# Patient Record
Sex: Male | Born: 1970 | Race: White | Hispanic: No | Marital: Married | Smoking: Never smoker
Health system: Southern US, Community
[De-identification: ages and names within clinical notes are randomized; demographics above are authoritative.]

## PROBLEM LIST (undated history)

## (undated) DIAGNOSIS — J342 Deviated nasal septum: Secondary | ICD-10-CM

## (undated) DIAGNOSIS — G629 Polyneuropathy, unspecified: Secondary | ICD-10-CM

## (undated) HISTORY — PX: NASAL SEPTUM SURGERY: SHX37

## (undated) HISTORY — DX: Deviated nasal septum: J34.2

## (undated) HISTORY — PX: SPINAL FUSION: SHX223

## (undated) HISTORY — DX: Polyneuropathy, unspecified: G62.9

---

## 2018-05-13 ENCOUNTER — Encounter: Payer: Self-pay | Admitting: Neurology

## 2018-07-11 ENCOUNTER — Ambulatory Visit: Payer: BLUE CROSS/BLUE SHIELD | Admitting: Neurology

## 2018-08-08 NOTE — Progress Notes (Signed)
New Patient Virtual Visit via Video Note The purpose of this virtual visit is to provide medical care while limiting exposure to the novel coronavirus.    Consent was obtained for video visit:  Yes.   Answered questions that patient had about telehealth interaction:  Yes.   I discussed the limitations, risks, security and privacy concerns of performing an evaluation and management service by telemedicine. I also discussed with the patient that there may be a patient responsible charge related to this service. The patient expressed understanding and agreed to proceed.  Pt location: Home Physician Location: office Name of referring provider:  Erline Levine, MD I connected with Andrew Downs at patients initiation/request on 08/09/2018 at  2:00 PM EDT by video enabled telemedicine application and verified that I am speaking with the correct person using two identifiers. Pt MRN:  [id number] Pt DOB:  09-May-1970 Video Participants:  Andrew Downs    History of Present Illness: Andrew Downs is a 48 y.o. right-handed Caucasian male with cervical fusion C5-6 (2016) presenting for evaluation of bilateral hand paresthesias.   Starting around 2019, he began having spells of hand cramping, wrist pain, and numbness of the fingers.  He works as Psychologist, prison and probation services and has noticed weakness with lifting objects or with overuse. Sometimes, he wakes up in the morning with his hands tingling.   He also has tingling of the feet.  He does not have diabetes, thyroid disease, or history of alcohol use.  He does not have a PCP.  He underwent cervical fusion at C5-6 by Dr. Vertell Limber in 2016.  He reports having bilateral hand and feet paresthesias, concerning for neuropathy.  Patient was referred to see me for further evaluation.  NCS/EMG of the arms performed in March 22, 2018 was normal.    Past Medical History:  Diagnosis Date  . Deviated septum   . Neuropathy     Past Surgical History:  Procedure Laterality Date  . NASAL  SEPTUM SURGERY    . SPINAL FUSION       Medications:  Outpatient Encounter Medications as of 08/09/2018  Medication Sig  . [DISCONTINUED] albuterol (VENTOLIN HFA) 108 (90 Base) MCG/ACT inhaler INHALE 2 PUFFS BY MOUTH EVERY 4 HOURS AS NEEDED FOR DYSPNEA   No facility-administered encounter medications on file as of 08/09/2018.     Allergies: No Known Allergies  Family History: Family History  Problem Relation Age of Onset  . Heart attack Paternal Grandfather     Social History: Social History   Tobacco Use  . Smoking status: Never Smoker  . Smokeless tobacco: Never Used  Substance Use Topics  . Alcohol use: Yes  . Drug use: Not Currently   Social History   Social History Narrative   Lives with wife and children in a 2 story home.  Has 2 children and 2 step children.  Works for [employer].  Education: high school.      Review of Systems:  CONSTITUTIONAL: No fevers, chills, night sweats, or weight loss.   EYES: No visual changes or eye pain ENT: No hearing changes.  No history of nose bleeds.   RESPIRATORY: No cough, wheezing and shortness of breath.   CARDIOVASCULAR: Negative for chest pain, and palpitations.   GI: Negative for abdominal discomfort, blood in stools or black stools.  No recent change in bowel habits.   GU:  No history of incontinence.   MUSCLOSKELETAL: No history of joint pain or swelling.  No myalgias.   SKIN: Negative for lesions,  rash, and itching.   HEMATOLOGY/ONCOLOGY: Negative for prolonged bleeding, bruising easily, and swollen nodes.  No history of cancer.  ENDOCRINE: Negative for cold or heat intolerance, polydipsia or goiter.   PSYCH:  No depression or anxiety symptoms.   NEURO: As Above.   Vital Signs:  Ht '6\' 1"'$  (1.854 m)   Wt 185 lb (83.9 kg)   BMI 24.41 kg/m    General Medical Exam:  Well appearing, comfortable.  Nonlabored breathing.   Neurological Exam: MENTAL STATUS including orientation to time, place, person, recent and  remote memory, attention span and concentration, language, and fund of knowledge is normal.  Speech is not dysarthric.  CRANIAL NERVES:  Normal conjugate, extra-ocular eye movements in all directions of gaze.  No ptosis.  Normal facial symmetry and movements.  Normal shoulder shrug and head rotation.  Tongue is midline.  MOTOR:  Antigravity in all extremities.  No abnormal movements.  No pronator drift.   SENSORY:  Prayer test is positive on left and Phalen's testing is positive bilaterally.   COORDINATION/GAIT: Normal finger to nose bilaterally.  Intact rapid alternating movements bilaterally.  Able to rise from a chair without using arms.  Gait narrow based and stable.   IMPRESSION/PLAN: Bilateral hand paresthesias. Symptoms are concerning for entrapment neuropathy, namely carpal tunnel syndrome. I recommend NCS/EMG of the arms with palmer comparison studies.  In the meantime, I have suggested that he start to use a wrist splint to minimize hyperflexion at the wrist and see if this provides relief.  I will also check ESR, CRP, TSH, vitamin B12, CBC, and CMP.  He was urged to establish care with PCP to keep up with preventative care.   Follow Up Instructions:  I discussed the assessment and treatment plan with the patient. The patient was provided an opportunity to ask questions and all were answered. The patient agreed with the plan and demonstrated an understanding of the instructions.   The patient was advised to call back or seek an in-person evaluation if the symptoms worsen or if the condition fails to improve as anticipated.  Return to clinic after testing   Alda Berthold, DO

## 2018-08-09 ENCOUNTER — Encounter: Payer: Self-pay | Admitting: *Deleted

## 2018-08-09 ENCOUNTER — Other Ambulatory Visit: Payer: Self-pay | Admitting: *Deleted

## 2018-08-09 ENCOUNTER — Telehealth (INDEPENDENT_AMBULATORY_CARE_PROVIDER_SITE_OTHER): Payer: BLUE CROSS/BLUE SHIELD | Admitting: Neurology

## 2018-08-09 ENCOUNTER — Encounter: Payer: Self-pay | Admitting: Neurology

## 2018-08-09 ENCOUNTER — Other Ambulatory Visit: Payer: Self-pay

## 2018-08-09 VITALS — Ht 73.0 in | Wt 185.0 lb

## 2018-08-09 DIAGNOSIS — R202 Paresthesia of skin: Secondary | ICD-10-CM

## 2018-08-09 NOTE — Progress Notes (Signed)
EMG ordered

## 2018-09-19 ENCOUNTER — Ambulatory Visit: Payer: BLUE CROSS/BLUE SHIELD | Admitting: Neurology

## 2018-09-19 HISTORY — PX: REFRACTIVE SURGERY: SHX103

## 2018-10-11 ENCOUNTER — Other Ambulatory Visit: Payer: Self-pay

## 2018-10-11 ENCOUNTER — Encounter: Payer: Self-pay | Admitting: Neurology

## 2018-10-11 ENCOUNTER — Ambulatory Visit (INDEPENDENT_AMBULATORY_CARE_PROVIDER_SITE_OTHER): Payer: BC Managed Care – PPO | Admitting: Neurology

## 2018-10-11 VITALS — BP 122/72 | HR 72 | Ht 73.0 in | Wt 191.0 lb

## 2018-10-11 DIAGNOSIS — G5621 Lesion of ulnar nerve, right upper limb: Secondary | ICD-10-CM | POA: Insufficient documentation

## 2018-10-11 DIAGNOSIS — R252 Cramp and spasm: Secondary | ICD-10-CM

## 2018-10-11 DIAGNOSIS — G5603 Carpal tunnel syndrome, bilateral upper limbs: Secondary | ICD-10-CM

## 2018-10-11 DIAGNOSIS — R202 Paresthesia of skin: Secondary | ICD-10-CM | POA: Diagnosis not present

## 2018-10-11 DIAGNOSIS — M25532 Pain in left wrist: Secondary | ICD-10-CM

## 2018-10-11 DIAGNOSIS — M4716 Other spondylosis with myelopathy, lumbar region: Secondary | ICD-10-CM | POA: Diagnosis not present

## 2018-10-11 MED ORDER — CYCLOBENZAPRINE HCL 5 MG PO TABS: 5.0000 mg | ORAL_TABLET | Freq: Every evening | ORAL | 1 refills | Status: DC | PRN

## 2018-10-11 NOTE — Procedures (Signed)
[facility]  [place], [place]  [address], [place] [address] Tel: [phone] Fax:  [phone] Test Date:  10/11/2018  Patient: Andrew Downs DOB: 1971/01/20 Physician: Narda Amber, DO  Sex: Male Height: 6\' 1"  Ref Phys: Narda Amber, DO  ID#: [id number] Temp: 34.0C Technician:    Patient Complaints: This is a 48 year old man with history of C5-6 fusion (2016) referred for evaluation of bilateral hand tingling, cramping, and left wrist pain.  NCV & EMG Findings: Extensive electrodiagnostic testing of the right upper extremity and additional studies of the left shows:  1. Bilateral median sensory responses show reduced amplitude (R18.2, L19.0 V).  Bilateral mixed palmar sensory responses show absolute prolonged latency.  Bilateral ulnar sensory responses are within normal limits. 2. Bilateral median and left ulnar motor responses are within normal limits.  Right ulnar motor response shows slowed conduction velocity across the elbow (A Elbow-B Elbow, 45 m/s).   3. Chronic motor axonal loss changes are seen affecting the ulnar innervated muscles on the right, without accompanied active denervation.    Impression: 1. 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 2. 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   ___________________________ Narda Amber, DO    Nerve Conduction Studies Anti Sensory Summary Table   Site NR Peak (ms) Norm Peak (ms) P-T Amp (V) Norm P-T Amp  Left Median Anti Sensory (2nd Digit)  34C  Wrist    3.0 <3.4 19.0 >20  Right Median Anti Sensory (2nd Digit)  34C  Wrist    3.0 <3.4 18.2 >20  Left Ulnar Anti Sensory (5th Digit)  34C  Wrist    2.7 <3.1 16.5 >12  Right Ulnar Anti Sensory (5th Digit)  34C  Wrist    2.7 <3.1 20.4 >12   Motor Summary Table   Site NR Onset (ms) Norm Onset (ms) O-P Amp (mV) Norm O-P  Amp Site1 Site2 Delta-0 (ms) Dist (cm) Vel (m/s) Norm Vel (m/s)  Left Median Motor (Abd Poll Brev)  34C  Wrist    2.9 <3.9 12.2 >6 Elbow Wrist 4.9 30.0 61 >50  Elbow    7.8  11.9         Right Median Motor (Abd Poll Brev)  34C  Wrist    2.8 <3.9 11.3 >6 Elbow Wrist 4.9 31.0 63 >50  Elbow    7.7  11.0         Left Ulnar Motor (Abd Dig Minimi)  34C  Wrist    2.1 <3.1 11.6 >7 B Elbow Wrist 3.8 25.0 66 >50  B Elbow    5.9  11.3  A Elbow B Elbow 1.4 10.0 71 >50  A Elbow    7.3  11.0         Right Ulnar Motor (Abd Dig Minimi)  34C  Wrist    2.0 <3.1 11.8 >7 B Elbow Wrist 3.1 23.0 74 >50  B Elbow    5.1  10.7  A Elbow B Elbow 2.2 10.0 45 >50  A Elbow    7.3  10.6          Comparison Summary Table   Site NR Peak (ms) Norm Peak (ms) P-T Amp (V) Site1 Site2 Delta-P (ms) Norm Delta (ms)  Left Median/Ulnar Palm Comparison (Wrist - 8cm)  34C  Median Palm    2.0 <2.2 33.6 Median Palm Ulnar Palm 0.5  Ulnar Palm    1.5 <2.2 17.6      Right Median/Ulnar Palm Comparison (Wrist - 8cm)  34C  Median Palm    2.0 <2.2 31.5 Median Palm Ulnar Palm 0.5   Ulnar Palm    1.5 <2.2 20.1       EMG   Side Muscle Ins Act Fibs Psw Fasc Number Recrt Dur Dur. Amp Amp. Poly Poly. Comment  Left Deltoid Nml Nml Nml Nml Nml Nml Nml Nml Nml Nml Nml Nml N/A  Right Abd Poll Brev Nml Nml Nml Nml Nml Nml Nml Nml Nml Nml Nml Nml N/A  Right Ext Indicis Nml Nml Nml Nml Nml Nml Nml Nml Nml Nml Nml Nml N/A  Right PronatorTeres Nml Nml Nml Nml Nml Nml Nml Nml Nml Nml Nml Nml N/A  Right Biceps Nml Nml Nml Nml Nml Nml Nml Nml Nml Nml Nml Nml N/A  Right Triceps Nml Nml Nml Nml Nml Nml Nml Nml Nml Nml Nml Nml N/A  Right Deltoid Nml Nml Nml Nml Nml Nml Nml Nml Nml Nml Nml Nml N/A  Left Ext Indicis Nml Nml Nml Nml Nml Nml Nml Nml Nml Nml Nml Nml N/A  Left PronatorTeres Nml Nml Nml Nml Nml Nml Nml Nml Nml Nml Nml Nml N/A  Left 1stDorInt Nml Nml Nml Nml Nml Nml Nml Nml Nml Nml Nml Nml N/A  Left Triceps Nml Nml Nml Nml Nml Nml  Nml Nml Nml Nml Nml Nml N/A  Left Biceps Nml Nml Nml Nml Nml Nml Nml Nml Nml Nml Nml Nml N/A  Right FlexCarpiUln Nml Nml Nml Nml 1- Rapid Some 1+ Some 1+ Some 1+ N/A  Right 1stDorInt Nml Nml Nml Nml 1- Rapid Some 1+ Some 1+ Some 1+ N/A  Right ABD Dig Min Nml Nml Nml Nml 1- Rapid Some 1+ Some 1+ Some 1+ N/A      Waveforms:

## 2018-10-11 NOTE — Progress Notes (Signed)
Follow-up Visit   Date: 10/11/18   Andrew Downs MRN: [id number] DOB: October 08, 1970   Interim History: Andrew GoltzBarry Crudup is a 48 y.o. right-handed Caucasian male with history of cervical fusion at C5-6 (2016) returning to the clinic for follow-up of bilateral hand paresthesias and left wrist pain.  The patient was accompanied to the clinic by self.   History of present illness: Starting around 2019, he began having spells of hand cramping, wrist pain, and numbness of the fingers.  He works as [employer] and has noticed weakness with lifting objects or with overuse. Sometimes, he wakes up in the morning with his hands tingling.   He also has tingling of the feet.  He does not have diabetes, thyroid disease, or history of alcohol use.  He does not have a PCP.  He underwent cervical fusion at C5-6 by Dr. Venetia MaxonStern in 2016.  He reports having bilateral hand and feet paresthesias, concerning for neuropathy.  Patient was referred to see me for further evaluation.  NCS/EMG of the arms performed in March 22, 2018 was normal.   UPDATE 10/11/2018: He is here for follow-up of bilateral hand tingling and also complains of achy left wrist pain.  At his initial visit with me via video, I recommended that he start to use a wrist splint and he reports much less tingling in the hand after doing this.  He is mostly bothered by achy left wrist pain, which is worse with extension and flexion.  He continues to have painful cramps in the hands which often occurs in the early morning.  He also complains of almost daily spells of shooting radicular left leg pain.  Pain is worse with foot extension, such as pressing on the clutch in his car.  He tells me that prior MRI lumbar spine several years ago indicated disc protrusion, however it was decided to manage this conservatively, unless symptoms worsen.  He denies any weakness of the leg.  Medications:  No current outpatient medications on file prior to visit.   No current  facility-administered medications on file prior to visit.     Allergies: No Known Allergies  Review of Systems:  CONSTITUTIONAL: No fevers, chills, night sweats, or weight loss.  EYES: No visual changes or eye pain ENT: No hearing changes.  No history of nose bleeds.   RESPIRATORY: No cough, wheezing and shortness of breath.   CARDIOVASCULAR: Negative for chest pain, and palpitations.   GI: Negative for abdominal discomfort, blood in stools or black stools.  No recent change in bowel habits.   GU:  No history of incontinence.   MUSCLOSKELETAL: No history of joint pain or swelling.  No myalgias.   SKIN: Negative for lesions, rash, and itching.   ENDOCRINE: Negative for cold or heat intolerance, polydipsia or goiter.   PSYCH:  No depression or anxiety symptoms.   NEURO: As Above.   Vital Signs:  BP 122/72   Pulse 72   Ht 6\' 1"  (1.854 m)   Wt 191 lb (86.6 kg)   SpO2 96%   BMI 25.20 kg/m    General Medical Exam:   General:  Well appearing, comfortable  Eyes/ENT: see cranial nerve examination.   Neck:  No carotid bruits. Respiratory:  Clear to auscultation, good air entry bilaterally.   Cardiac:  Regular rate and rhythm, no murmur.   Ext:  No edema   Neurological Exam: MENTAL STATUS including orientation to time, place, person, recent and remote memory, attention span and concentration, language,  and fund of knowledge is normal.  Speech is not dysarthric.  CRANIAL NERVES:  No visual field defects.  Pupils equal round and reactive to light.  Normal conjugate, extra-ocular eye movements in all directions of gaze.  No ptosis.  Face is symmetric. Palate elevates symmetrically.  Tongue is midline.  MOTOR:  Motor strength is 5/5 in all extremities.  No atrophy, fasciculations or abnormal movements.  No pronator drift.  Tone is normal.    MSRs:                                           Right        Left brachioradialis 2+  2+  biceps 2+  2+  triceps 2+  2+  patellar 3+  3+   ankle jerk 2+  2+  Hoffman no  no  plantar response down  down   SENSORY:  Intact to vibration, temperature, and pinprick throughout.  COORDINATION/GAIT:   Gait narrow based and stable.   Data: NCS/EMG of the arms 10/11/2018: 1. 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 2. 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  IMPRESSION/PLAN: 1.  Bilateral carpal tunnel syndrome, mild.    - Recommend that he start using wrist splints and avoid hyperflexion of the wrist.   2.  Right cubital tunnel syndrome, mild.    - Patient was made aware of arm positioning which can stretch or compress the ulnar nerve at the elbow, and I discussed strategies to minimize this. 3.  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  - MRI lumbar spine wo contrast to assess for compressive lesion 4.  Bilateral hand cramps, possibly related to history of cervical canal stenosis  -Start Flexeril 5 mg at bedtime as needed for cramps, this may also help with his leg pain. 5.  Left wrist pain, seems musculoskeletal.  I will refer him to Dr. Hulan Saas in Sports Medicine  Further recommendations pending results.  Thank you for allowing me to participate in patient's care.  If I can answer any additional questions, I would be pleased to do so.    Sincerely,    Catrell Morrone K. Posey Pronto, DO

## 2018-10-11 NOTE — Patient Instructions (Addendum)
MRI lumbar spine without contrast  Referral to Sports Medicine   Start flexeril 5mg  at bedtime for muscle cramps  We will call you with the results

## 2018-11-03 ENCOUNTER — Other Ambulatory Visit: Payer: Self-pay

## 2018-11-03 ENCOUNTER — Ambulatory Visit (INDEPENDENT_AMBULATORY_CARE_PROVIDER_SITE_OTHER): Payer: BC Managed Care – PPO | Admitting: Family Medicine

## 2018-11-03 ENCOUNTER — Encounter: Payer: Self-pay | Admitting: Family Medicine

## 2018-11-03 ENCOUNTER — Ambulatory Visit: Payer: Self-pay

## 2018-11-03 VITALS — BP 120/82 | HR 61 | Ht 73.0 in | Wt 191.0 lb

## 2018-11-03 DIAGNOSIS — M25532 Pain in left wrist: Secondary | ICD-10-CM

## 2018-11-03 DIAGNOSIS — M720 Palmar fascial fibromatosis [Dupuytren]: Secondary | ICD-10-CM | POA: Insufficient documentation

## 2018-11-03 NOTE — Patient Instructions (Signed)
Good to see you  Wrap fingers straight with sleeping for next 2 weeks Will take 2 weeks for the injections to work  Get some gloves with gel pads on the palms  See me again in 6 weeks if not better we will consider labs

## 2018-11-03 NOTE — Assessment & Plan Note (Signed)
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  We does not seem to be chronic at the moment.  I do think that there is a possible vascular compromise Secondary to atrophy noted on ultrasound.  Patient has been doing bracing at night, responding well to the injections.  Follow-up with me again in 5 to 6 weeks.

## 2018-11-03 NOTE — Progress Notes (Signed)
[facility] [address] [place], [place] [address] Phone: [phone] Subjective:   I Andrew Downs am serving as a Education administrator for Dr. Hulan Saas.  I'm seeing this patient by the request  of:  Patel DO  CC: Left wrist pain  LPF:[id number]  Andrew Downs is a 48 y.o. male coming in with complaint of left wrist pain. Has some numbness in the fingerings and cramps in the 4th and 5th fingers.   Onset- Chronic Location - wrist  Duration-  Character- sharp, dull, achy, sore Aggravating factors- weakness in the wrist, weak by the end of the day  Reliving factors- icy hot, carpel tunnel brace at night  Therapies tried-  Severity-sometimes 8 out of 10     Past Medical History:  Diagnosis Date  . Deviated septum   . Neuropathy    Past Surgical History:  Procedure Laterality Date  . NASAL SEPTUM SURGERY    . REFRACTIVE SURGERY Bilateral 09/2018  . SPINAL FUSION     Social History   Socioeconomic History  . Marital status: Married    Spouse name: Not on file  . Number of children: 2  . Years of education: 43  . Highest education level: High school graduate  Occupational History    Employer: [employer]  . Financial resource strain: Not on file  . Food insecurity    Worry: Not on file    Inability: Not on file  . Transportation needs    Medical: Not on file    Non-medical: Not on file  Tobacco Use  . Smoking status: Never Smoker  . Smokeless tobacco: Never Used  Substance and Sexual Activity  . Alcohol use: Yes  . Drug use: Not Currently  . Sexual activity: Not on file  Lifestyle  . Physical activity    Days per week: Not on file    Minutes per session: Not on file  . Stress: Not on file  Relationships  . Social Herbalist on phone: Not on file    Gets together: Not on file    Attends religious service: Not on file    Active member of club or organization: Not on file    Attends meetings of clubs or  organizations: Not on file    Relationship status: Not on file  Other Topics Concern  . Not on file  Social History Narrative   Lives with wife and children in a 2 story home.  Has 2 children and 2 step children.  Works for [employer].  Education: high school.     No Known Allergies Family History  Problem Relation Age of Onset  . Heart attack Paternal Grandfather          Current Outpatient Medications (Other):  .  cyclobenzaprine (FLEXERIL) 5 MG tablet, Take 1 tablet (5 mg total) by mouth at bedtime as needed for muscle spasms.    Past medical history, social, surgical and family history all reviewed in electronic medical record.  No pertanent information unless stated regarding to the chief complaint.   Review of Systems:  No headache, visual changes, nausea, vomiting, diarrhea, constipation, dizziness, abdominal pain, skin rash, fevers, chills, night sweats, weight loss, swollen lymph nodes, body aches, joint swelling, muscle aches, chest pain, shortness of breath, mood changes.   Objective  Blood pressure 120/82, pulse 61, height 6\' 1"  (1.854 m), weight 191 lb (86.6 kg), SpO2 94 %.   General: No apparent distress  alert and oriented x3 mood and affect normal, dressed appropriately.  HEENT: Pupils equal, extraocular movements intact  Respiratory: Patient's speak in full sentences and does not appear short of breath  Cardiovascular: No lower extremity edema, non tender, no erythema  Skin: Warm dry intact with no signs of infection or rash on extremities or on axial skeleton.  Abdomen: Soft nontender  Neuro: Cranial nerves II through XII are intact, neurovascularly intact in all extremities with 2+ DTRs and 2+ pulses.  Lymph: No lymphadenopathy of posterior or anterior cervical chain or axillae bilaterally.  Gait normal with good balance and coordination.  MSK:  Non tender with full range of motion and good stability and symmetric strength and tone of shoulders, elbows,   hip, knee and ankles bilaterally.  Patient was moving bilateral extremities and negative Tinel sign today.  Near full range of motion.  Left hand does have some trigger nodules of the A2 pulley of the fourth and fifth fingers.  Seems to be more of a scarring aspect that seems to be between the fingers.  Nodules are noted within the tendon sheath.  Good grip strength.  Neurovascular intact distally.  No pain over Guyon's canal  Limited musculoskeletal ultrasound was performed by Judi SaaZachary M Gwyndolyn Guilford  Limited ultrasound shows the patient is to trigger flexor tendon sheaths of the fourth and fifth metatarsals.  Seems to be chronic with calcific changes.  No increase in Doppler flow Impression: Trigger nodules versus possible early contracture  Procedure: Real-time Ultrasound Guided Injection of left fifth flexor tendon sheath Device: GE Logiq Q7 Ultrasound guided injection is preferred based studies that show increased duration, increased effect, greater accuracy, decreased procedural pain, increased response rate, and decreased cost with ultrasound guided versus blind injection.  Verbal informed consent obtained.  Time-out conducted.  Noted no overlying erythema, induration, or other signs of local infection.  Skin prepped in a sterile fashion.  Local anesthesia: Topical Ethyl chloride.  With sterile technique and under real time ultrasound guidance: 25-gauge half inch needle injected with 0.5 cc of 0.5% Marcaine and 0.5 cc of Kenalog 40 mg per Completed without difficulty  Pain immediately resolved suggesting accurate placement of the medication.  Advised to call if fevers/chills, erythema, induration, drainage, or persistent bleeding.  Images permanently stored and available for review in the ultrasound unit.  Impression: Technically successful ultrasound guided injection.  Procedure: Real-time Ultrasound Guided Injection of left fourth flexor tendon sheath Device: GE Logiq Q7 Ultrasound guided  injection is preferred based studies that show increased duration, increased effect, greater accuracy, decreased procedural pain, increased response rate, and decreased cost with ultrasound guided versus blind injection.  Verbal informed consent obtained.  Time-out conducted.  Noted no overlying erythema, induration, or other signs of local infection.  Skin prepped in a sterile fashion.  Local anesthesia: Topical Ethyl chloride.  With sterile technique and under real time ultrasound guidance: With a 25-gauge half inch needle injected with 0.5 cc of 0.5% Marcaine and 0.5 cc of Kenalog 40 mg per Completed without difficulty  Pain immediately resolved suggesting accurate placement of the medication.  Advised to call if fevers/chills, erythema, induration, drainage, or persistent bleeding.  Images permanently stored and available for review in the ultrasound unit.  Impression: Technically successful ultrasound guided injection.    Impression and Recommendations:     This case required medical decision making of moderate complexity. The above documentation has been reviewed and is accurate and complete Judi SaaZachary M Naaman Curro, DO  Note: This dictation was prepared with Dragon dictation along with smaller phrase technology. Any transcriptional errors that result from this process are unintentional.

## 2018-11-04 ENCOUNTER — Ambulatory Visit
Admission: RE | Admit: 2018-11-04 | Discharge: 2018-11-04 | Disposition: A | Discharge status: Home / Self Care | Payer: BC Managed Care – PPO | Source: Ambulatory Visit | Attending: Neurology | Admitting: Neurology

## 2018-11-04 DIAGNOSIS — M4716 Other spondylosis with myelopathy, lumbar region: Secondary | ICD-10-CM

## 2018-11-07 ENCOUNTER — Telehealth: Payer: Self-pay

## 2018-11-07 ENCOUNTER — Telehealth: Payer: Self-pay | Admitting: Neurology

## 2018-11-07 NOTE — Telephone Encounter (Signed)
New Message ° °Patient returning nurses call °

## 2018-11-07 NOTE — Telephone Encounter (Signed)
Left message for patient to call office for results. °

## 2018-11-08 NOTE — Telephone Encounter (Signed)
-----   Message from Donika K Patel, DO sent at 11/07/2018  9:49 AM EDT ----- Please inform patient that his MRI lumbar spine is stable, mild age-related/degenerative changes with no nerve impingement.  If he is still having pain in the left leg, I recommend physical therapy - pls order, if he is agreeable. Thanks. 

## 2018-11-08 NOTE — Telephone Encounter (Signed)
Left message for patient to contact office for results.

## 2018-11-09 ENCOUNTER — Telehealth: Payer: Self-pay

## 2018-11-09 NOTE — Telephone Encounter (Signed)
Informed patient of MRI results.  Patient declines physical therapy at this time.  He has done it in the past and did not have long term relief.

## 2018-11-09 NOTE — Telephone Encounter (Signed)
-----   Message from Andrew Berthold, DO sent at 11/07/2018  9:49 AM EDT ----- Please inform patient that his MRI lumbar spine is stable, mild age-related/degenerative changes with no nerve impingement.  If he is still having pain in the left leg, I recommend physical therapy - pls order, if he is agreeable. Thanks.

## 2018-12-12 NOTE — Progress Notes (Signed)
Andrew Downs D.O. [facility] [address] [place], [place] [address] Phone: [phone] Subjective:   Andrew Downs, Andrew Downs, am serving as a scribe for Dr. Antoine PrimasZachary Downs.  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  We does not seem to be chronic at the moment.  I do think that there is a possible vascular compromise Secondary to atrophy noted on ultrasound.  Patient has been doing bracing at night, responding well to the injections.  Follow-up with me again in 5 to 6 weeks.  Update 12/13/2018 Andrew GoltzBarry Downs is a 48 y.o. male coming in with complaint of left hand pain. Patient states that his cramping is less. Injection did lessen the intensity of the cramping. Is having left wrist pain near the ulnar styloid process. Is now having cramping in the right hand.  Patient is concerned because he does not want him to get as bad as the other side.  Did not feel that the trigger injections were somewhat helpful. Impression:  1. 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 2. 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     Past Medical History:  Diagnosis Date  . Deviated septum   . Neuropathy    Past Surgical History:  Procedure Laterality Date  . NASAL SEPTUM SURGERY    . REFRACTIVE SURGERY Bilateral 09/2018  . SPINAL FUSION     Social History   Socioeconomic History  . Marital status: Married    Spouse name: Not on file  . Number of children: 2  . Years of education: 4812  . Highest education level: High school graduate  Occupational History    Employer: [employer]  Social Needs  . Financial resource strain: Not on file  . Food insecurity    Worry: Not on file    Inability: Not on file  .  Transportation needs    Medical: Not on file    Non-medical: Not on file  Tobacco Use  . Smoking status: Never Smoker  . Smokeless tobacco: Never Used  Substance and Sexual Activity  . Alcohol use: Yes  . Drug use: Not Currently  . Sexual activity: Not on file  Lifestyle  . Physical activity    Days per week: Not on file    Minutes per session: Not on file  . Stress: Not on file  Relationships  . Social Musicianconnections    Talks on phone: Not on file    Gets together: Not on file    Attends religious service: Not on file    Active member of club or organization: Not on file    Attends meetings of clubs or organizations: Not on file    Relationship status: Not on file  Other Topics Concern  . Not on file  Social History Narrative   Lives with wife and children in a 2 story home.  Has 2 children and 2 step children.  Works for [employer].  Education: high school.     No Known Allergies Family History  Problem Relation Age of Onset  . Heart attack Paternal Grandfather          Current Outpatient Medications (Other):  .  cyclobenzaprine (FLEXERIL) 5 MG tablet, Take 1 tablet (5 mg  total) by mouth at bedtime as needed for muscle spasms. Marland Kitchen  venlafaxine XR (EFFEXOR XR) 37.5 MG 24 hr capsule, Take 1 capsule (37.5 mg total) by mouth daily with breakfast.    Past medical history, social, surgical and family history all reviewed in electronic medical record.  No pertanent information unless stated regarding to the chief complaint.   Review of Systems:  No headache, visual changes, nausea, vomiting, diarrhea, constipation, dizziness, abdominal pain, skin rash, fevers, chills, night sweats, weight loss, swollen lymph nodes, body aches, joint swelling, muscle aches, chest pain, shortness of breath, mood changes.  Positive muscle aches  Objective  Blood pressure 102/62, pulse 80, height 6\' 1"  (1.854 m), weight 191 lb (86.6 kg), SpO2 95 %.     General: No apparent distress alert  and oriented x3 mood and affect normal, dressed appropriately.  HEENT: Pupils equal, extraocular movements intact  Respiratory: Patient's speak in full sentences and does not appear short of breath  Cardiovascular: No lower extremity edema, non tender, no erythema  Skin: Warm dry intact with no signs of infection or rash on extremities or on axial skeleton.  Abdomen: Soft nontender  Neuro: Cranial nerves II through XII are intact, neurovascularly intact in all extremities with 2+ DTRs and 2+ pulses.  Lymph: No lymphadenopathy of posterior or anterior cervical chain or axillae bilaterally.  Gait normal with good balance and coordination.  MSK:  tender with full range of motion and good stability and symmetric strength and tone of shoulders, elbows,  hip, knee and ankles bilaterally.  Bilateral hand exam shows the patient does have more of a Dupuytren contracture but improvement noted on the left side.  Patient is starting to get the right side in the fourth finger flexor tendon sheath.  Patient states that the fifth 1 seems to be more painful.  Denies any numbness and good grip strength mild positive Tinel's bilaterally   Impression and Recommendations:     This case required medical decision making of moderate complexity. The above documentation has been reviewed and is accurate and complete [name], DO       Note: This dictation was prepared with Dragon dictation along with smaller phrase technology. Any transcriptional errors that result from this process are unintentional.

## 2018-12-13 ENCOUNTER — Ambulatory Visit: Payer: BC Managed Care – PPO | Admitting: Family Medicine

## 2018-12-13 ENCOUNTER — Other Ambulatory Visit: Payer: Self-pay

## 2018-12-13 ENCOUNTER — Other Ambulatory Visit (INDEPENDENT_AMBULATORY_CARE_PROVIDER_SITE_OTHER): Payer: BC Managed Care – PPO

## 2018-12-13 VITALS — BP 102/62 | HR 80 | Ht 73.0 in | Wt 191.0 lb

## 2018-12-13 DIAGNOSIS — G5603 Carpal tunnel syndrome, bilateral upper limbs: Secondary | ICD-10-CM

## 2018-12-13 DIAGNOSIS — M720 Palmar fascial fibromatosis [Dupuytren]: Secondary | ICD-10-CM

## 2018-12-13 DIAGNOSIS — M2550 Pain in unspecified joint: Secondary | ICD-10-CM

## 2018-12-13 LAB — FERRITIN: Ferritin: 35.1 ng/mL (ref 22.0–322.0)

## 2018-12-13 LAB — COMPREHENSIVE METABOLIC PANEL
ALT: 19 U/L (ref 0–53)
AST: 17 U/L (ref 0–37)
Albumin: 4.7 g/dL (ref 3.5–5.2)
Alkaline Phosphatase: 63 U/L (ref 39–117)
BUN: 17 mg/dL (ref 6–23)
CO2: 27 mEq/L (ref 19–32)
Calcium: 9.5 mg/dL (ref 8.4–10.5)
Chloride: 104 mEq/L (ref 96–112)
Creatinine, Ser: 0.7 mg/dL (ref 0.40–1.50)
GFR: 119.8 mL/min (ref >60.00)
Glucose, Bld: 121 mg/dL — ABNORMAL HIGH (ref 70–99)
Potassium: 3.7 mEq/L (ref 3.5–5.1)
Sodium: 139 mEq/L (ref 135–145)
Total Bilirubin: 0.4 mg/dL (ref 0.2–1.2)
Total Protein: 7.3 g/dL (ref 6.0–8.3)

## 2018-12-13 LAB — CBC WITH DIFFERENTIAL/PLATELET
Basophils Absolute: 0.1 10*3/uL (ref 0.0–0.1)
Basophils Relative: 1.2 % (ref 0.0–3.0)
Eosinophils Absolute: 0.2 10*3/uL (ref 0.0–0.7)
Eosinophils Relative: 2.2 % (ref 0.0–5.0)
HCT: 44.5 % (ref 39.0–52.0)
Hemoglobin: 15.2 g/dL (ref 13.0–17.0)
Lymphocytes Relative: 24.2 % (ref 12.0–46.0)
Lymphs Abs: 2.2 10*3/uL (ref 0.7–4.0)
MCHC: 34.2 g/dL (ref 30.0–36.0)
MCV: 89.7 fl (ref 78.0–100.0)
Monocytes Absolute: 1.1 10*3/uL — ABNORMAL HIGH (ref 0.1–1.0)
Monocytes Relative: 12.3 % — ABNORMAL HIGH (ref 3.0–12.0)
Neutro Abs: 5.5 10*3/uL (ref 1.4–7.7)
Neutrophils Relative %: 60.1 % (ref 43.0–77.0)
Platelets: 247 10*3/uL (ref 150.0–400.0)
RBC: 4.96 Mil/uL (ref 4.22–5.81)
RDW: 13 % (ref 11.5–15.5)
WBC: 9.1 10*3/uL (ref 4.0–10.5)

## 2018-12-13 LAB — IBC PANEL
Iron: 110 ug/dL (ref 42–165)
Saturation Ratios: 26.1 % (ref 20.0–50.0)
Transferrin: 301 mg/dL (ref 212.0–360.0)

## 2018-12-13 LAB — URIC ACID: Uric Acid, Serum: 5 mg/dL (ref 4.0–7.8)

## 2018-12-13 LAB — VITAMIN D 25 HYDROXY (VIT D DEFICIENCY, FRACTURES): VITD: 28.82 ng/mL — ABNORMAL LOW (ref 30.00–100.00)

## 2018-12-13 LAB — SEDIMENTATION RATE: Sed Rate: 4 mm/hr (ref 0–15)

## 2018-12-13 LAB — TESTOSTERONE: Testosterone: 242.85 ng/dL — ABNORMAL LOW (ref 300.00–890.00)

## 2018-12-13 LAB — TSH: TSH: 1.1 u[IU]/mL (ref 0.35–4.50)

## 2018-12-13 LAB — C-REACTIVE PROTEIN: CRP: <1 mg/dL (ref 0.5–20.0)

## 2018-12-13 MED ORDER — VENLAFAXINE HCL ER 37.5 MG PO CP24: 37.5000 mg | ORAL_CAPSULE | Freq: Every day | ORAL | 0 refills | Status: DC

## 2018-12-13 NOTE — Patient Instructions (Signed)
Labs downstairs Effexor 37.5 Monitor other side See me 4-6 weeks

## 2018-12-14 ENCOUNTER — Encounter: Payer: Self-pay | Admitting: Family Medicine

## 2018-12-14 LAB — RHEUMATOID FACTOR: Rheumatoid fact SerPl-aCnc: <14 IU/mL (ref <14)

## 2018-12-14 LAB — PTH, INTACT AND CALCIUM
Calcium: 9.8 mg/dL (ref 8.6–10.3)
PTH: 25 pg/mL (ref 14–64)

## 2018-12-14 LAB — ANA: Anti Nuclear Antibody (ANA): NEGATIVE

## 2018-12-14 LAB — ANGIOTENSIN CONVERTING ENZYME: Angiotensin-Converting Enzyme: 15 U/L (ref 9–67)

## 2018-12-14 LAB — CYCLIC CITRUL PEPTIDE ANTIBODY, IGG: Cyclic Citrullin Peptide Ab: 16 UNITS

## 2018-12-14 LAB — CALCIUM, IONIZED: Calcium, Ion: 5.21 mg/dL (ref 4.8–5.6)

## 2018-12-14 NOTE — Assessment & Plan Note (Signed)
Mild positive Tinel's but patient's symptoms are all in the ulnar nerve.  We will continue to monitor.

## 2018-12-14 NOTE — Assessment & Plan Note (Signed)
Patient did improve after the injections.  Can repeat.  Starting to have it bilateral.  Patient has had the EMG that was consistent with some peripheral mononeuropathy.  Further work-up will include laboratory work-up today.  See how patient responds and follow-up in 4 weeks

## 2019-01-21 ENCOUNTER — Other Ambulatory Visit: Payer: Self-pay | Admitting: Neurology

## 2019-01-23 ENCOUNTER — Other Ambulatory Visit: Payer: Self-pay

## 2019-01-23 MED ORDER — CYCLOBENZAPRINE HCL 5 MG PO TABS: 5.0000 mg | ORAL_TABLET | Freq: Every evening | ORAL | 1 refills | Status: AC | PRN

## 2019-01-23 NOTE — Progress Notes (Signed)
[facility] [address] [place], [place] [address] Phone: [phone] Subjective:   Andrew Downs, am serving as a scribe for Dr. Antoine Downs.   CC: Left wrist follow-up  PIR:Andrew Downs   8/25/202 Mild positive Tinel's but patient's symptoms are all in the ulnar nerve.  We will continue to monitor.  Patient did improve after the injections.  Can repeat.  Starting to have it bilateral.  Patient has had the EMG that was consistent with some peripheral mononeuropathy.  Further work-up will include laboratory work-up today.  See how patient responds and follow-up in 4 weeks  Update 01/24/2019 Andrew Downs is a 48 y.o. male coming in with complaint of left hand pain. Patient is also having pain in right hand. Patient states that 4th and 5th fingers are cramping every other day. Cramping has been occurring for 15 minute increments throughout the days when it occurs. Labs last visit.  Patient states that he is taking the Effexor 70 do better.  Ran out of the Effexor approximately 10 days ago.   Laboratory work-up showed patient did have low vitamin D as well as testosterone. Past Medical History:  Diagnosis Date  . Deviated septum   . Neuropathy    Past Surgical History:  Procedure Laterality Date  . NASAL SEPTUM SURGERY    . REFRACTIVE SURGERY Bilateral 09/2018  . SPINAL FUSION     Social History   Socioeconomic History  . Marital status: Married    Spouse name: Not on file  . Number of children: 2  . Years of education: 10  . Highest education level: High school graduate  Occupational History    Employer: [employer]  Social Needs  . Financial resource strain: Not on file  . Food insecurity    Worry: Not on file    Inability: Not on file  . Transportation needs    Medical: Not on file    Non-medical: Not on file  Tobacco Use  . Smoking status: Never Smoker  . Smokeless tobacco: Never Used  Substance and Sexual Activity  . Alcohol use:  Yes  . Drug use: Not Currently  . Sexual activity: Not on file  Lifestyle  . Physical activity    Days per week: Not on file    Minutes per session: Not on file  . Stress: Not on file  Relationships  . Social Musician on phone: Not on file    Gets together: Not on file    Attends religious service: Not on file    Active member of club or organization: Not on file    Attends meetings of clubs or organizations: Not on file    Relationship status: Not on file  Other Topics Concern  . Not on file  Social History Narrative   Lives with wife and children in a 2 story home.  Has 2 children and 2 step children.  Works for [employer].  Education: high school.     No Known Allergies Family History  Problem Relation Age of Onset  . Heart attack Paternal Grandfather          Current Outpatient Medications (Other):  .  cyclobenzaprine (FLEXERIL) 5 MG tablet, Take 1 tablet (5 mg total) by mouth at bedtime as needed for muscle spasms. Marland Kitchen  venlafaxine XR (EFFEXOR XR) 37.5 MG 24 hr capsule, Take 1 capsule (37.5 mg total) by mouth daily with breakfast. .  venlafaxine XR (EFFEXOR XR) 75 MG  24 hr capsule, Take 1 capsule (75 mg total) by mouth daily with breakfast. .  Vitamin D, Ergocalciferol, (DRISDOL) 1.25 MG (50000 UT) CAPS capsule, Take 1 capsule (50,000 Units total) by mouth every 7 (seven) days.    Past medical history, social, surgical and family history all reviewed in electronic medical record.  No pertanent information unless stated regarding to the chief complaint.   Review of Systems:  No headache, visual changes, nausea, vomiting, diarrhea, constipation, dizziness, abdominal pain, skin rash, fevers, chills, night sweats, weight loss, swollen lymph nodes, body aches, joint swelling,chest pain, shortness of breath, mood changes.  Positive muscle aches muscle cramping  Objective  Blood pressure 112/80, pulse 96, height 6\' 1"  (1.854 m), weight 194 lb (88 kg), SpO2 (!)  80 %.    General: No apparent distress alert and oriented x3 mood and affect normal, dressed appropriately.  HEENT: Pupils equal, extraocular movements intact  Respiratory: Patient's speak in full sentences and does not appear short of breath  Cardiovascular: No lower extremity edema, non tender, no erythema  Skin: Warm dry intact with no signs of infection or rash on extremities or on axial skeleton.  Abdomen: Soft nontender  Neuro: Cranial nerves II through XII are intact, neurovascularly intact in all extremities with 2+ DTRs and 2+ pulses.  Lymph: No lymphadenopathy of posterior or anterior cervical chain or axillae bilaterally.  Gait normal with good balance and coordination.  MSK:  Non tender with full range of motion and good stability and symmetric strength and tone of shoulders, elbows, hip, knee and ankles bilaterally.  Wrist: Bilateral Inspection normal with no visible erythema or swelling. ROM smooth and normal with good flexion and extension and ulnar/radial deviation that is symmetrical with opposite wrist. Palpation is normal over metacarpals, navicular, lunate, and TFCC; tendons without tenderness/ swelling No snuffbox tenderness. No tenderness over Canal of Guyon. Strength 5/5 in all directions without pain. Negative Finkelstein, tinel's and phalens. Negative Watson's test.  MSK US performed of:  This study was ordered, performed, and interpreted by Andrew Downs D.O.  Wrist: All extensor compartments visualized and tendons all normal in appearance without fraying, tears, or sheath effusions. No effusion seen. TFCC intact. Scapholunate ligament intact. Carpal tunnel visualized and median nerve area normal, flexor tendons all normal in appearance without fraying, tears, or sheath effusions. Power doppler signal normal.  IMPRESSION:  NORMAL ULTRASONOGRAPHIC EXAMINATION OF THE WRIST.   Impression and Recommendations:     This case required medical decision making of  moderate complexity. The above documentation has been reviewed and is accurate and complete Andrew Pulley, DO       Note: This dictation was prepared with Dragon dictation along with smaller phrase technology. Any transcriptional errors that result from this process are unintentional.

## 2019-01-24 ENCOUNTER — Encounter: Payer: Self-pay | Admitting: Family Medicine

## 2019-01-24 ENCOUNTER — Ambulatory Visit: Payer: BC Managed Care – PPO | Admitting: Family Medicine

## 2019-01-24 ENCOUNTER — Other Ambulatory Visit: Payer: Self-pay

## 2019-01-24 VITALS — BP 112/80 | HR 96 | Ht 73.0 in | Wt 194.0 lb

## 2019-01-24 DIAGNOSIS — E559 Vitamin D deficiency, unspecified: Secondary | ICD-10-CM | POA: Diagnosis not present

## 2019-01-24 DIAGNOSIS — R7989 Other specified abnormal findings of blood chemistry: Secondary | ICD-10-CM | POA: Diagnosis not present

## 2019-01-24 DIAGNOSIS — G589 Mononeuropathy, unspecified: Secondary | ICD-10-CM | POA: Diagnosis not present

## 2019-01-24 DIAGNOSIS — M2550 Pain in unspecified joint: Secondary | ICD-10-CM | POA: Diagnosis not present

## 2019-01-24 MED ORDER — VITAMIN D (ERGOCALCIFEROL) 1.25 MG (50000 UNIT) PO CAPS: 50000.0000 [IU] | ORAL_CAPSULE | ORAL | 0 refills | Status: DC

## 2019-01-24 MED ORDER — VENLAFAXINE HCL ER 75 MG PO CP24: 75.0000 mg | ORAL_CAPSULE | Freq: Every day | ORAL | 0 refills | Status: AC

## 2019-01-24 NOTE — Patient Instructions (Addendum)
Once weekly Vitamin D DHEA 50 mg daily for 4 weeks then 2 weeks off Effexor at 75mg  at night See me in 6-7 weeks

## 2019-01-25 ENCOUNTER — Encounter: Payer: Self-pay | Admitting: Family Medicine

## 2019-01-25 DIAGNOSIS — G629 Polyneuropathy, unspecified: Secondary | ICD-10-CM | POA: Insufficient documentation

## 2019-01-25 DIAGNOSIS — E559 Vitamin D deficiency, unspecified: Secondary | ICD-10-CM | POA: Insufficient documentation

## 2019-01-25 DIAGNOSIS — R7989 Other specified abnormal findings of blood chemistry: Secondary | ICD-10-CM | POA: Insufficient documentation

## 2019-01-25 NOTE — Assessment & Plan Note (Signed)
Patient does have peripheral neuropathy, and does have some carpal tunnel.  Patient though has more of a cramping in the ulnar distribution of the left and right.  With certain responding to the Effexor.  Increase to 75 mg and warned of potential side effects.  I am hoping that this continues to improve.  In addition to this we discussed secondary to the low testosterone and the low vitamin D could be contributing.  See if we can do supplementation and hope this will make some benefit as well. Discussed the possibility of carpal tunnel injections but I do not think secondary to patient's symptoms being more in the ulnar aspect that would make a significant difference.  Patient will try this and come back and see me again in 6 weeks.

## 2019-02-13 ENCOUNTER — Other Ambulatory Visit: Payer: Self-pay | Admitting: Family Medicine

## 2019-02-15 ENCOUNTER — Other Ambulatory Visit: Payer: Self-pay | Admitting: Family Medicine

## 2019-03-06 ENCOUNTER — Other Ambulatory Visit (INDEPENDENT_AMBULATORY_CARE_PROVIDER_SITE_OTHER): Payer: BC Managed Care – PPO

## 2019-03-06 DIAGNOSIS — M2550 Pain in unspecified joint: Secondary | ICD-10-CM

## 2019-03-06 LAB — TESTOSTERONE: Testosterone: 224.42 ng/dL — ABNORMAL LOW (ref 300.00–890.00)

## 2019-03-06 NOTE — Progress Notes (Signed)
[facility] [address] [place], [place] [address] Phone: [phone] Subjective:   Fontaine No, am serving as a scribe for Dr. Hulan Saas.   CC: Polyarthralgia follow-up  BJY:NWGNFAOZHY   01/24/2019 Patient does have peripheral neuropathy, and does have some carpal tunnel.  Patient though has more of a cramping in the ulnar distribution of the left and right.  With certain responding to the Effexor.  Increase to 75 mg and warned of potential side effects.  I am hoping that this continues to improve.  In addition to this we discussed secondary to the low testosterone and the low vitamin D could be contributing.  See if we can do supplementation and hope this will make some benefit as well. Discussed the possibility of carpal tunnel injections but I do not think secondary to patient's symptoms being more in the ulnar aspect that would make a significant difference.  Patient will try this and come back and see me again in 6 weeks.  Update 03/07/2019 Vahe Pienta is a 48 y.o. male coming in with complaint of bilateral wrist pain. Patient states that his wrist pain is worse than last visit. L>R. Three weeks ago pain increased. Fingers 4 and 5 are numb up to his wrist. Takes once weekly vitamin D and effexor. No noticing any difference.      Past Medical History:  Diagnosis Date  . Deviated septum   . Neuropathy    Past Surgical History:  Procedure Laterality Date  . NASAL SEPTUM SURGERY    . REFRACTIVE SURGERY Bilateral 09/2018  . SPINAL FUSION     Social History   Socioeconomic History  . Marital status: Married    Spouse name: Not on file  . Number of children: 2  . Years of education: 107  . Highest education level: High school graduate  Occupational History    Employer: [employer]  . Financial resource strain: Not on file  . Food insecurity    Worry: Not on file    Inability: Not on file  . Transportation needs    Medical:  Not on file    Non-medical: Not on file  Tobacco Use  . Smoking status: Never Smoker  . Smokeless tobacco: Never Used  Substance and Sexual Activity  . Alcohol use: Yes  . Drug use: Not Currently  . Sexual activity: Not on file  Lifestyle  . Physical activity    Days per week: Not on file    Minutes per session: Not on file  . Stress: Not on file  Relationships  . Social Herbalist on phone: Not on file    Gets together: Not on file    Attends religious service: Not on file    Active member of club or organization: Not on file    Attends meetings of clubs or organizations: Not on file    Relationship status: Not on file  Other Topics Concern  . Not on file  Social History Narrative   Lives with wife and children in a 2 story home.  Has 2 children and 2 step children.  Works for [employer].  Education: high school.     No Known Allergies Family History  Problem Relation Age of Onset  . Heart attack Paternal Grandfather          Current Outpatient Medications (Other):  .  cyclobenzaprine (FLEXERIL) 5 MG tablet, Take 1 tablet (5 mg total) by mouth  at bedtime as needed for muscle spasms. Marland Kitchen  venlafaxine XR (EFFEXOR XR) 75 MG 24 hr capsule, Take 1 capsule (75 mg total) by mouth daily with breakfast. .  Vitamin D, Ergocalciferol, (DRISDOL) 1.25 MG (50000 UT) CAPS capsule, TAKE 1 CAPSULE BY MOUTH ONE TIME PER WEEK    Past medical history, social, surgical and family history all reviewed in electronic medical record.  No pertanent information unless stated regarding to the chief complaint.   Review of Systems:  No headache, visual changes, nausea, vomiting, diarrhea, constipation, dizziness, abdominal pain, skin rash, fevers, chills, night sweats, weight loss, swollen lymph nodes, body aches, joint swelling, muscle aches, chest pain, shortness of breath, mood changes.   Objective  There were no vitals taken for this visit. Systems examined below as of     General: No apparent distress alert and oriented x3 mood and affect normal, dressed appropriately.  HEENT: Pupils equal, extraocular movements intact  Respiratory: Patient's speak in full sentences and does not appear short of breath  Cardiovascular: No lower extremity edema, non tender, no erythema  Skin: Warm dry intact with no signs of infection or rash on extremities or on axial skeleton.  Abdomen: Soft nontender  Neuro: Cranial nerves II through XII are intact, neurovascularly intact in all extremities with 2+ DTRs and 2+ pulses.  Lymph: No lymphadenopathy of posterior or anterior cervical chain or axillae bilaterally.  Gait normal with good balance and coordination.  MSK:  Non tender with full range of motion and good stability and symmetric strength and tone of shoulders, elbows, hip, knee and ankles bilaterally.  Bilateral wrist exam shows some very mild positive Phalen's.  Patient does not have still mild trigger nodules on the left hand fourth and fifth flexor tendon sheath but no triggering noted.  Mild tenderness in that area.  Good grip strength noted.  Negative Tinel's of the right elbow today.  Full range of motion noted     Impression and Recommendations:     . The above documentation has been reviewed and is accurate and complete Judi Saa, DO       Note: This dictation was prepared with Dragon dictation along with smaller phrase technology. Any transcriptional errors that result from this process are unintentional.

## 2019-03-07 ENCOUNTER — Other Ambulatory Visit: Payer: Self-pay

## 2019-03-07 ENCOUNTER — Ambulatory Visit: Payer: BC Managed Care – PPO | Admitting: Family Medicine

## 2019-03-07 ENCOUNTER — Encounter: Payer: Self-pay | Admitting: Family Medicine

## 2019-03-07 VITALS — BP 122/74 | HR 83 | Ht 73.0 in | Wt 196.0 lb

## 2019-03-07 DIAGNOSIS — R7989 Other specified abnormal findings of blood chemistry: Secondary | ICD-10-CM

## 2019-03-07 DIAGNOSIS — G5603 Carpal tunnel syndrome, bilateral upper limbs: Secondary | ICD-10-CM | POA: Diagnosis not present

## 2019-03-07 NOTE — Patient Instructions (Signed)
Good to see you  We will get you in with endocrinology and see if the can help the testosterone  Watch on ergonomics with work and see if change grip to help  We will wait til we hear from endocrinology and then decide when to see you again and what to do with effexor Happy holidays!

## 2019-03-07 NOTE — Assessment & Plan Note (Signed)
Likely secondary to ergonomics at work.  Patient was having more of the neuropathy now on the ulnar aspect.  We will continue to monitor.  Follow-up again in 2 months

## 2019-03-07 NOTE — Assessment & Plan Note (Signed)
Patient continues to have a low testosterone that could be something that is contributing to some of the discomfort and pain as well as some of the neuropathy.  We discussed thrombosis low likelihood and but patient's EMG did show the carpal tunnel and patient is a job with repetitive motions likely contributing.  We discussed the ergonomics.  Patient will be referred for low testosterone to endocrinology to see if patient should supplement.  No change in medications at this time otherwise.

## 2019-05-09 ENCOUNTER — Ambulatory Visit: Payer: BC Managed Care – PPO | Admitting: Endocrinology

## 2019-05-09 ENCOUNTER — Other Ambulatory Visit: Payer: Self-pay | Admitting: Family Medicine

## 2019-05-19 ENCOUNTER — Other Ambulatory Visit: Payer: Self-pay

## 2019-05-23 ENCOUNTER — Ambulatory Visit (INDEPENDENT_AMBULATORY_CARE_PROVIDER_SITE_OTHER): Payer: BC Managed Care – PPO | Admitting: Endocrinology

## 2019-05-23 ENCOUNTER — Encounter: Payer: Self-pay | Admitting: Endocrinology

## 2019-05-23 ENCOUNTER — Other Ambulatory Visit: Payer: Self-pay

## 2019-05-23 VITALS — BP 124/74 | HR 75 | Ht 73.0 in | Wt 197.4 lb

## 2019-05-23 DIAGNOSIS — R200 Anesthesia of skin: Secondary | ICD-10-CM | POA: Insufficient documentation

## 2019-05-23 DIAGNOSIS — G589 Mononeuropathy, unspecified: Secondary | ICD-10-CM | POA: Diagnosis not present

## 2019-05-23 DIAGNOSIS — R3911 Hesitancy of micturition: Secondary | ICD-10-CM | POA: Insufficient documentation

## 2019-05-23 NOTE — Progress Notes (Signed)
Subjective:    Patient ID: Andrew Downs, male    DOB: 1970/12/08, 49 y.o.   MRN: [id number]  HPI Pt is referred by Dr Tamala Julian, for hypogonadism.  Pt reports he had puberty at the normal age.  He has 2 biological children.  He took OTC testosterone-increasing product x approx 3 mos, in approx 2005, but none since.  He has never been on any prescribed medication for hypogonadism.  He does not take antiandrogens or opioids.  He denies any h/o infertility, XRT, or genital infection.  He has never had surgery, or a serious injury to the head or genital area. He has no h/o sleep apnea or DVT.   He does not consume alcohol excessively.  He had vasectomy in 2006.  He reports fatigue and weight gain Past Medical History:  Diagnosis Date  . Deviated septum   . Neuropathy     Past Surgical History:  Procedure Laterality Date  . NASAL SEPTUM SURGERY    . REFRACTIVE SURGERY Bilateral 09/2018  . SPINAL FUSION      Social History   Socioeconomic History  . Marital status: Married    Spouse name: Not on file  . Number of children: 2  . Years of education: 31  . Highest education level: High school graduate  Occupational History    Employer: [employer]  Tobacco Use  . Smoking status: Never Smoker  . Smokeless tobacco: Never Used  Substance and Sexual Activity  . Alcohol use: Yes  . Drug use: Not Currently  . Sexual activity: Not on file  Other Topics Concern  . Not on file  Social History Narrative   Lives with wife and children in a 2 story home.  Has 2 children and 2 step children.  Works for [employer].  Education: high school.     Social Determinants of Health   Financial Resource Strain:   . Difficulty of Paying Living Expenses: Not on file  Food Insecurity:   . Worried About Charity fundraiser in the Last Year: Not on file  . Ran Out of Food in the Last Year: Not on file  Transportation Needs:   . Lack of Transportation (Medical): Not on file  . Lack of Transportation  (Non-Medical): Not on file  Physical Activity:   . Days of Exercise per Week: Not on file  . Minutes of Exercise per Session: Not on file  Stress:   . Feeling of Stress : Not on file  Social Connections:   . Frequency of Communication with Friends and Family: Not on file  . Frequency of Social Gatherings with Friends and Family: Not on file  . Attends Religious Services: Not on file  . Active Member of Clubs or Organizations: Not on file  . Attends Archivist Meetings: Not on file  . Marital Status: Not on file  Intimate Partner Violence:   . Fear of Current or Ex-Partner: Not on file  . Emotionally Abused: Not on file  . Physically Abused: Not on file  . Sexually Abused: Not on file    Current Outpatient Medications on File Prior to Visit  Medication Sig Dispense Refill  . cyclobenzaprine (FLEXERIL) 5 MG tablet Take 1 tablet (5 mg total) by mouth at bedtime as needed for muscle spasms. 30 tablet 1  . venlafaxine XR (EFFEXOR XR) 75 MG 24 hr capsule Take 1 capsule (75 mg total) by mouth daily with breakfast. 30 capsule 0  . Vitamin D, Ergocalciferol, (DRISDOL) 1.25  MG (50000 UNIT) CAPS capsule TAKE 1 CAPSULE BY MOUTH ONE TIME PER WEEK 12 capsule 0   No current facility-administered medications on file prior to visit.    No Known Allergies  Family History  Problem Relation Age of Onset  . Heart attack Paternal Grandfather   . Other Neg Hx        hypogonadism    BP 124/74 (BP Location: Left Arm, Patient Position: Sitting, Cuff Size: Normal)   Pulse 75   Ht 6\' 1"  (1.854 m)   Wt 197 lb 6.4 oz (89.5 kg)   SpO2 98%   BMI 26.04 kg/m     Review of Systems denies gynecomastia, muscle weakness, fever, sob, rash, and blurry vision.  He has decreased libido, mild depression, ED sxs (viagra did not help), urinary hesitancy, headache, and slight numbness of all 4's.       Objective:   Physical Exam VS: see vs page GEN: no distress HEAD: head: no deformity eyes: no  periorbital swelling, no proptosis external nose and ears are normal NECK: supple, thyroid is not enlarged CHEST WALL: no deformity LUNGS: clear to auscultation BREASTS:  No gynecomastia CV: reg rate and rhythm, no murmur.   GENITALIA:  Normal male.   MUSCULOSKELETAL: muscle bulk and strength are grossly normal.  no obvious joint swelling.  gait is normal and steady EXTEMITIES: no deformity is visible.  no leg edema PULSES: no carotid bruit NEURO:  cn 2-12 grossly intact.   readily moves all 4's.  sensation is intact to touch on all 4's. SKIN:  Normal texture and temperature.  No rash or suspicious lesion is visible.  Normal hair distribution. NODES:  None palpable at the neck PSYCH: alert, well-oriented.  Does not appear anxious nor depressed.    Lab Results  Component Value Date   TESTOSTERONE 224.42 (L) 03/06/2019   Lab Results  Component Value Date   TSH 1.10 12/13/2018   Lab Results  Component Value Date   WBC 9.1 12/13/2018   HGB 15.2 12/13/2018   HCT 44.5 12/13/2018   MCV 89.7 12/13/2018   PLT 247.0 12/13/2018   I have reviewed outside records, and summarized: Pt was noted to have low testosterone, and referred here.  Main probs addressed were polyarthralgia and fatigue      Assessment & Plan:  Low testosterone, new to me, uncertain etiology.   Patient Instructions  Blood tests are requested for you today.  We'll let you know about the results.  Based on the results, I hope to be able to prescribe for you a pill to increase the testosterone. You would then recheck the blood test in 4-6 weeks, at [facility] in [place]. Please come back for a follow-up appointment in 1 year. Testosterone treatment has risks, including increased or decreased fertility (depending on the type of treatment), hair loss, prostate cancer, benign prostate enlargement, blood clots, liver problems, lower hdl ("good cholesterol"), polycythemia (opposite of anemia), sleep apnea, and behavior  changes.

## 2019-05-23 NOTE — Patient Instructions (Signed)
Blood tests are requested for you today.  We'll let you know about the results.  Based on the results, I hope to be able to prescribe for you a pill to increase the testosterone. You would then recheck the blood test in 4-6 weeks, at [facility] in [place]. Please come back for a follow-up appointment in 1 year. Testosterone treatment has risks, including increased or decreased fertility (depending on the type of treatment), hair loss, prostate cancer, benign prostate enlargement, blood clots, liver problems, lower hdl ("good cholesterol"), polycythemia (opposite of anemia), sleep apnea, and behavior changes.

## 2019-05-24 ENCOUNTER — Encounter: Payer: Self-pay | Admitting: Endocrinology

## 2019-05-24 LAB — TESTOSTERONE,FREE AND TOTAL
Testosterone, Free: 12.7 pg/mL (ref 6.8–21.5)
Testosterone: 311 ng/dL (ref 264–916)

## 2019-05-24 LAB — PSA: PSA: 2.72 ng/mL (ref 0.10–4.00)

## 2019-05-24 LAB — PROLACTIN: Prolactin: 3.4 ng/mL (ref 2.0–18.0)

## 2019-05-24 LAB — LUTEINIZING HORMONE: LH: 2.96 m[IU]/mL (ref 1.50–9.30)

## 2019-05-24 LAB — VITAMIN B12: Vitamin B-12: 440 pg/mL (ref 211–911)

## 2019-05-24 LAB — FOLLICLE STIMULATING HORMONE: FSH: 8 m[IU]/mL (ref 1.4–18.1)

## 2019-06-01 ENCOUNTER — Other Ambulatory Visit: Payer: Self-pay | Admitting: Family Medicine

## 2019-06-22 ENCOUNTER — Other Ambulatory Visit: Payer: Self-pay | Admitting: Family Medicine

## 2019-07-15 ENCOUNTER — Other Ambulatory Visit: Payer: Self-pay | Admitting: Family Medicine

## 2020-05-22 ENCOUNTER — Ambulatory Visit: Payer: BC Managed Care – PPO | Admitting: Endocrinology

## 2020-08-18 IMAGING — MR MRI LUMBAR SPINE WITHOUT CONTRAST
4 of 5 series · 27 of 48 positions shown · non-contrast
Comparison: Prior MRI from 04/05/2015.

CLINICAL DATA: Initial evaluation for low back pain with tingling
in the left lower extremity since 0398, recently worsened.

EXAM:
MRI LUMBAR SPINE WITHOUT CONTRAST
TECHNIQUE: Multiplanar, multisequence MR imaging of the lumbar spine was
performed. No intravenous contrast was administered.

[Series 2: T2 · sagittal · 4.0mm · 1.09mm/px · 7 of 18 slices shown (1 of 2)]
[im 1/18]
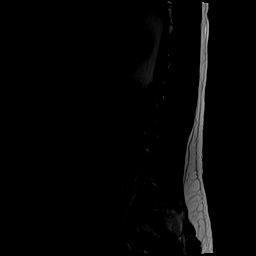
[im 3/18]
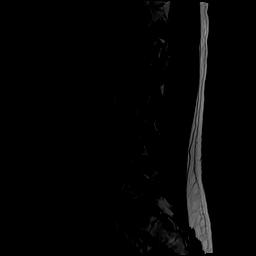
[im 6/18]
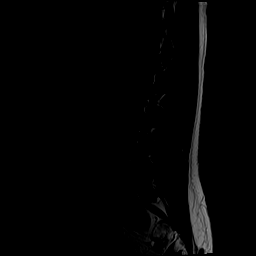
[im 9/18]
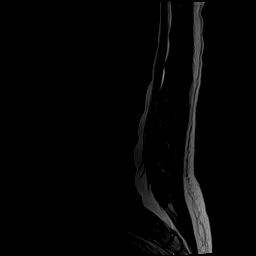
[im 12/18]
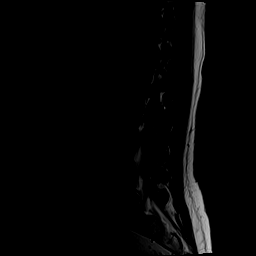
[im 15/18]
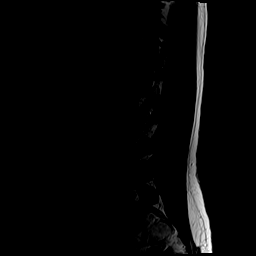
[im 18/18]
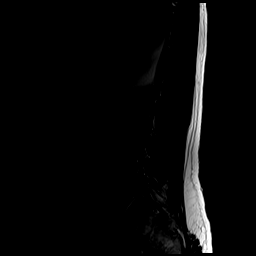

[Series 4: T1 · sagittal · 4.0mm · 1.09mm/px · 6 of 16 slices shown (1 of 2)]
[im 1/16]
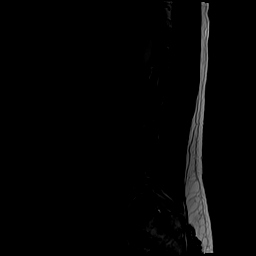
[im 4/16]
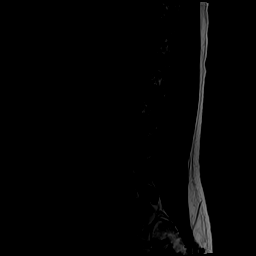
[im 7/16]
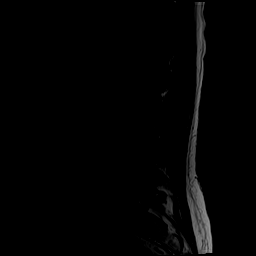
[im 10/16]
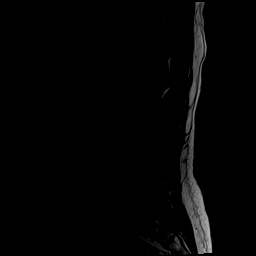
[im 13/16]
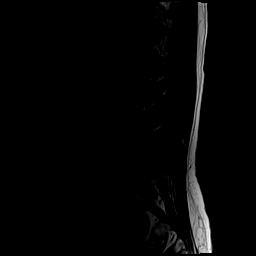
[im 16/16]
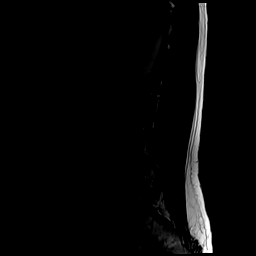

[Series 5: T2 · axial · 4.0mm · 0.39mm/px · z∈[-130,+90]mm · 9 of 43 slices shown (2 of 2)]
[im 1/43]
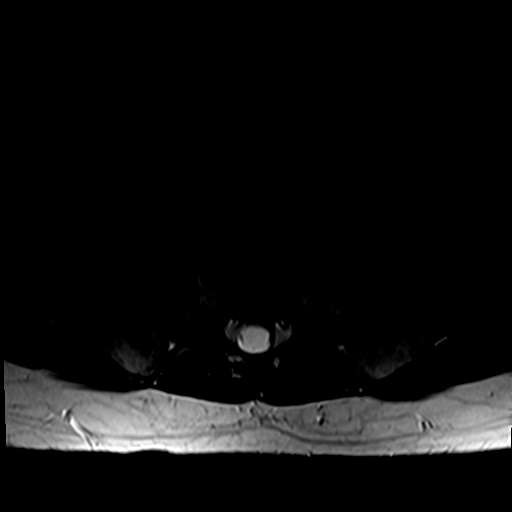
[im 7/43]
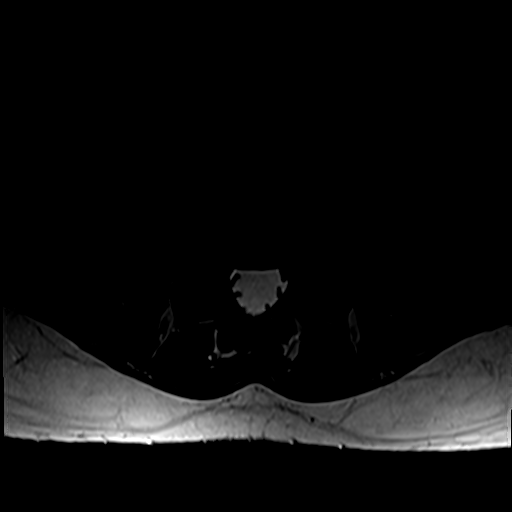
[im 13/43]
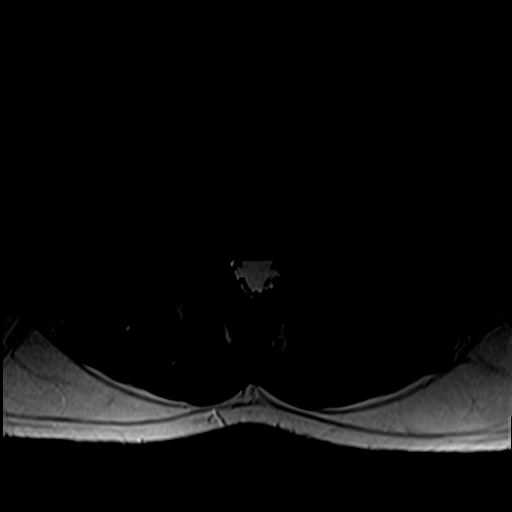
[im 19/43]
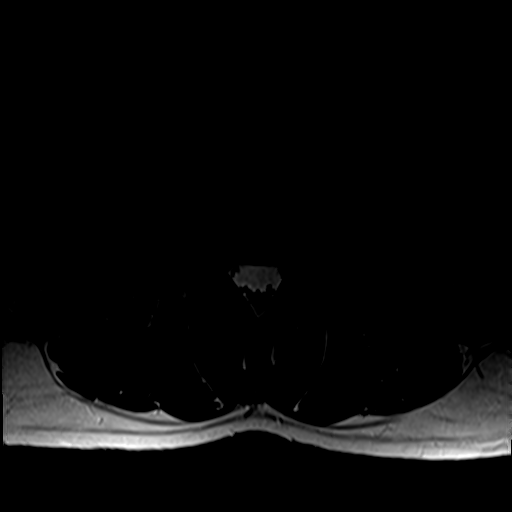
[im 22/43]
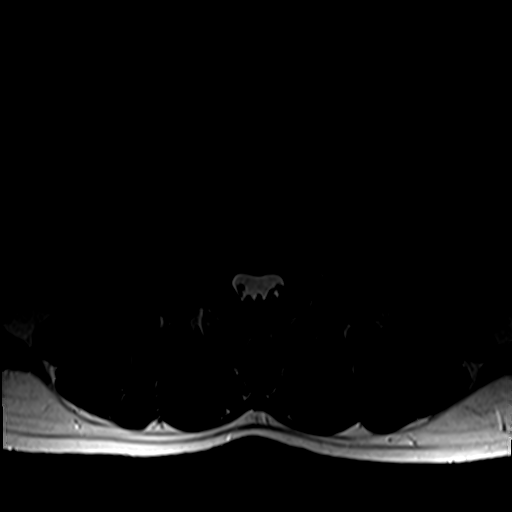
[im 25/43]
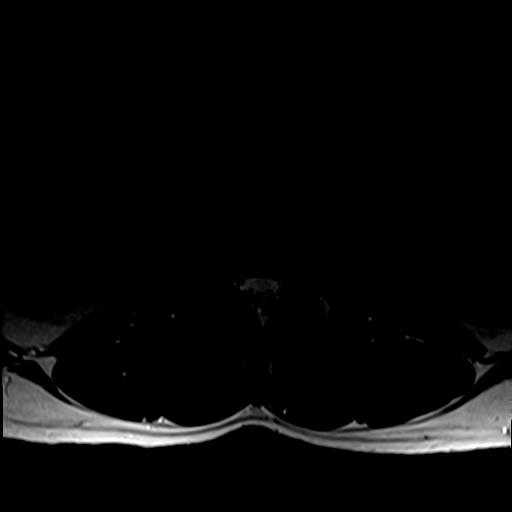
[im 31/43]
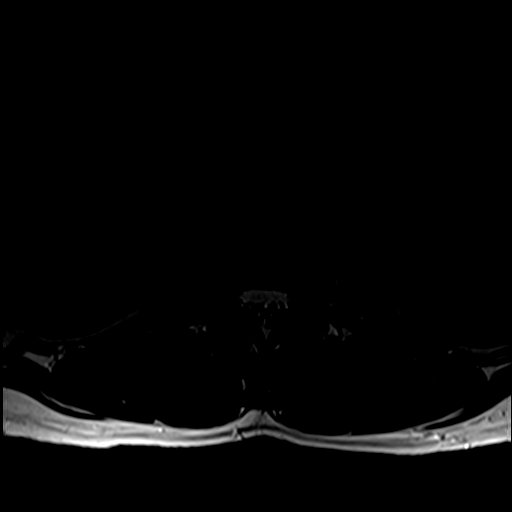
[im 37/43]
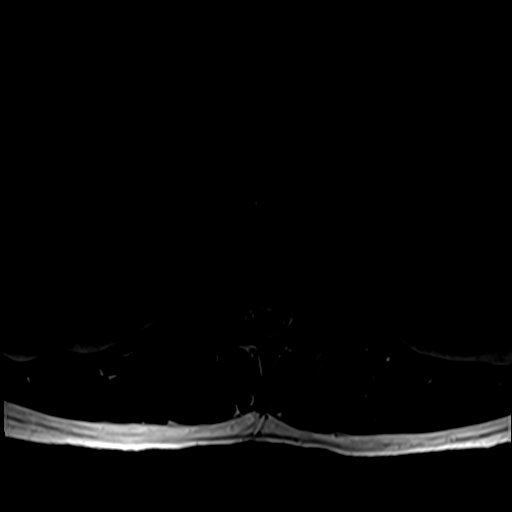
[im 43/43]
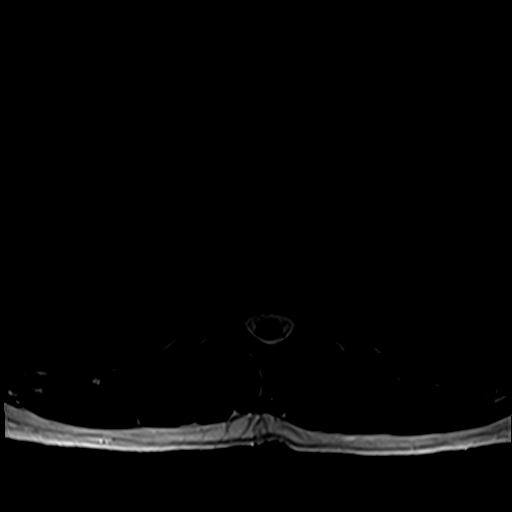

[Series 6: T1 · axial · 4.0mm · 0.39mm/px · z∈[-127,+56]mm · 5 of 40 slices shown (2 of 2)]
[im 1/40]
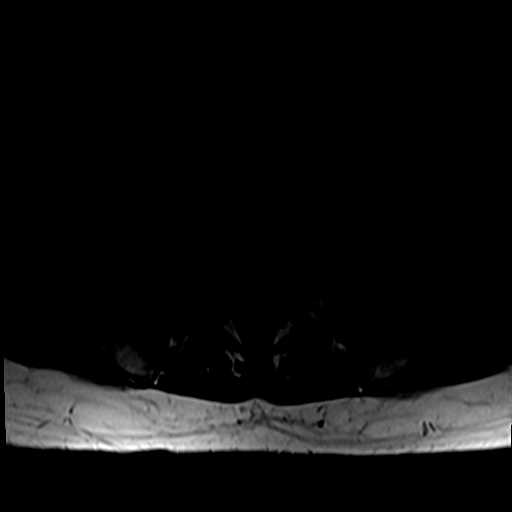
[im 7/40]
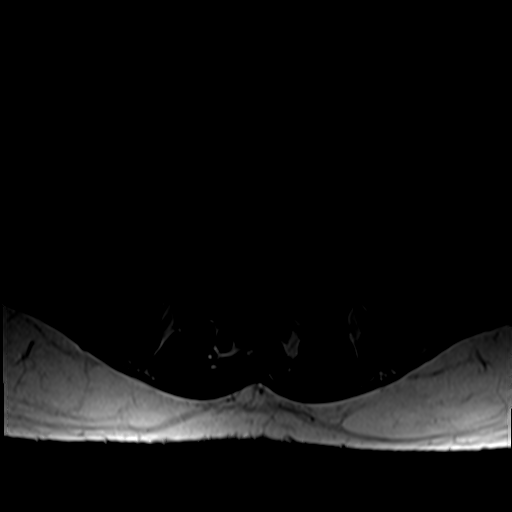
[im 13/40]
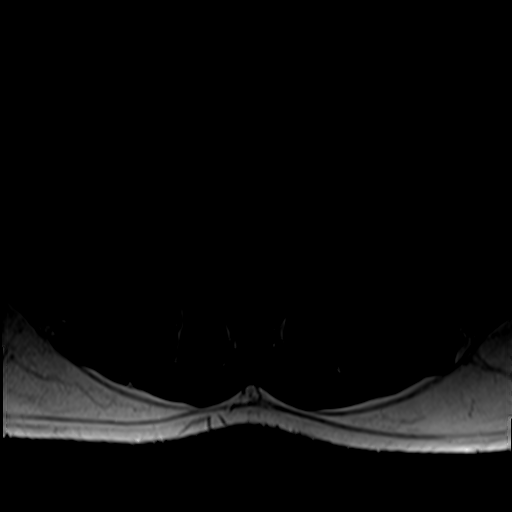
[im 22/40]
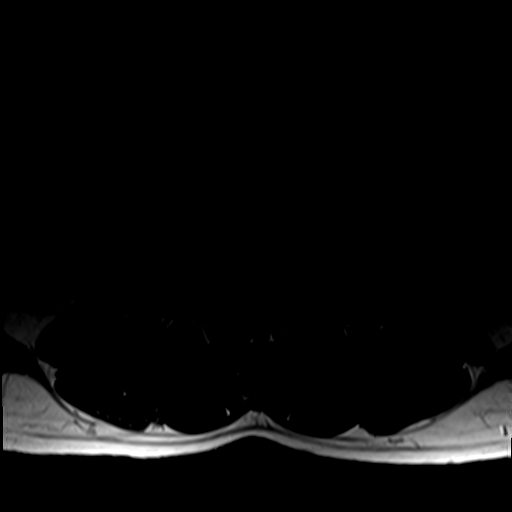
[im 34/40]
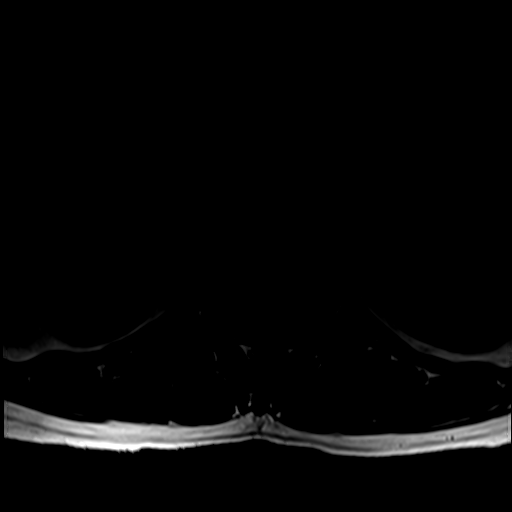

[27 of 48 positions shown; findings below may reference images not displayed]

FINDINGS: Segmentation: Standard. Lowest well-formed disc labeled the L5-S1
level.

Alignment: Mild straightening of the normal lumbar lordosis. No
listhesis or malalignment.

Vertebrae: Vertebral body height maintained without evidence for
acute or chronic fracture. Bone marrow signal intensity within
normal limits. No discrete or worrisome osseous lesions. No abnormal
marrow edema.

Conus medullaris and cauda equina: Conus extends to the L2 level.
Conus and cauda equina appear normal.

Paraspinal and other soft tissues: Paraspinous soft tissues within
normal limits. Visualized visceral structures are normal.

Disc levels:

T12-L1: Disc desiccation without significant disc bulge. No canal or
foraminal stenosis.

L1-2: Mild diffuse disc bulge with disc desiccation. Superimposed
tiny central disc protrusion minimally indents the ventral thecal
sac. No significant canal or foraminal stenosis.

L2-3: Mild annular disc bulge with disc desiccation. Mild facet
hypertrophy. No significant canal or foraminal stenosis.

L3-4: Mild annular disc bulge with disc desiccation. No significant
canal or foraminal stenosis.

L4-5: Negative interspace. Moderate bilateral facet hypertrophy,
slightly greater on the left. No significant canal or lateral recess
stenosis. Mild bilateral L4 foraminal narrowing. No frank
impingement.

L5-S1: Disc desiccation with shallow broad-based posterior disc
bulge, mildly flattening the ventral thecal sac. Associated
posterior annular fissure. Mild facet hypertrophy. No canal or
lateral recess stenosis. Foramina remain patent. No impingement.
IMPRESSION: 1. Mild multilevel degenerative disc bulging at L1-2 through L5-S1
without frank disc herniation or neural impingement. No significant
spinal stenosis. Overall, changes are mildly progressed relative to
0214.
2. Mild to moderate bilateral facet hypertrophy at L2-3 through
L4-5, most pronounced at L4-5 on the left. Findings could contribute
to underlying low back pain.
